# Patient Record
Sex: Male | Born: 1970 | Race: White | Hispanic: No | Marital: Married
Health system: Southern US, Community
[De-identification: ages and names within clinical notes are randomized; demographics above are authoritative.]

---

## 2000-01-29 ENCOUNTER — Encounter: Admission: RE | Admit: 2000-01-29 | Discharge: 2000-01-29 | Payer: Self-pay | Admitting: *Deleted

## 2000-01-29 ENCOUNTER — Encounter: Payer: Self-pay | Admitting: *Deleted

## 2012-06-02 ENCOUNTER — Ambulatory Visit
Admission: RE | Admit: 2012-06-02 | Discharge: 2012-06-02 | Disposition: A | Discharge status: Home / Self Care | Payer: BC Managed Care – PPO | Source: Ambulatory Visit | Attending: Family Medicine | Admitting: Family Medicine

## 2012-06-02 ENCOUNTER — Other Ambulatory Visit: Payer: Self-pay | Admitting: Family Medicine

## 2012-06-02 DIAGNOSIS — M25579 Pain in unspecified ankle and joints of unspecified foot: Secondary | ICD-10-CM

## 2013-12-12 IMAGING — CR DG FOOT COMPLETE 3+V*R*
3 series · 3 of 3 positions shown · non-contrast
Comparison: None.

CLINICAL DATA: Fell off ladder 2 weeks ago with pain laterally

RIGHT FOOT COMPLETE - 3+ VIEW

[t foot ap right]
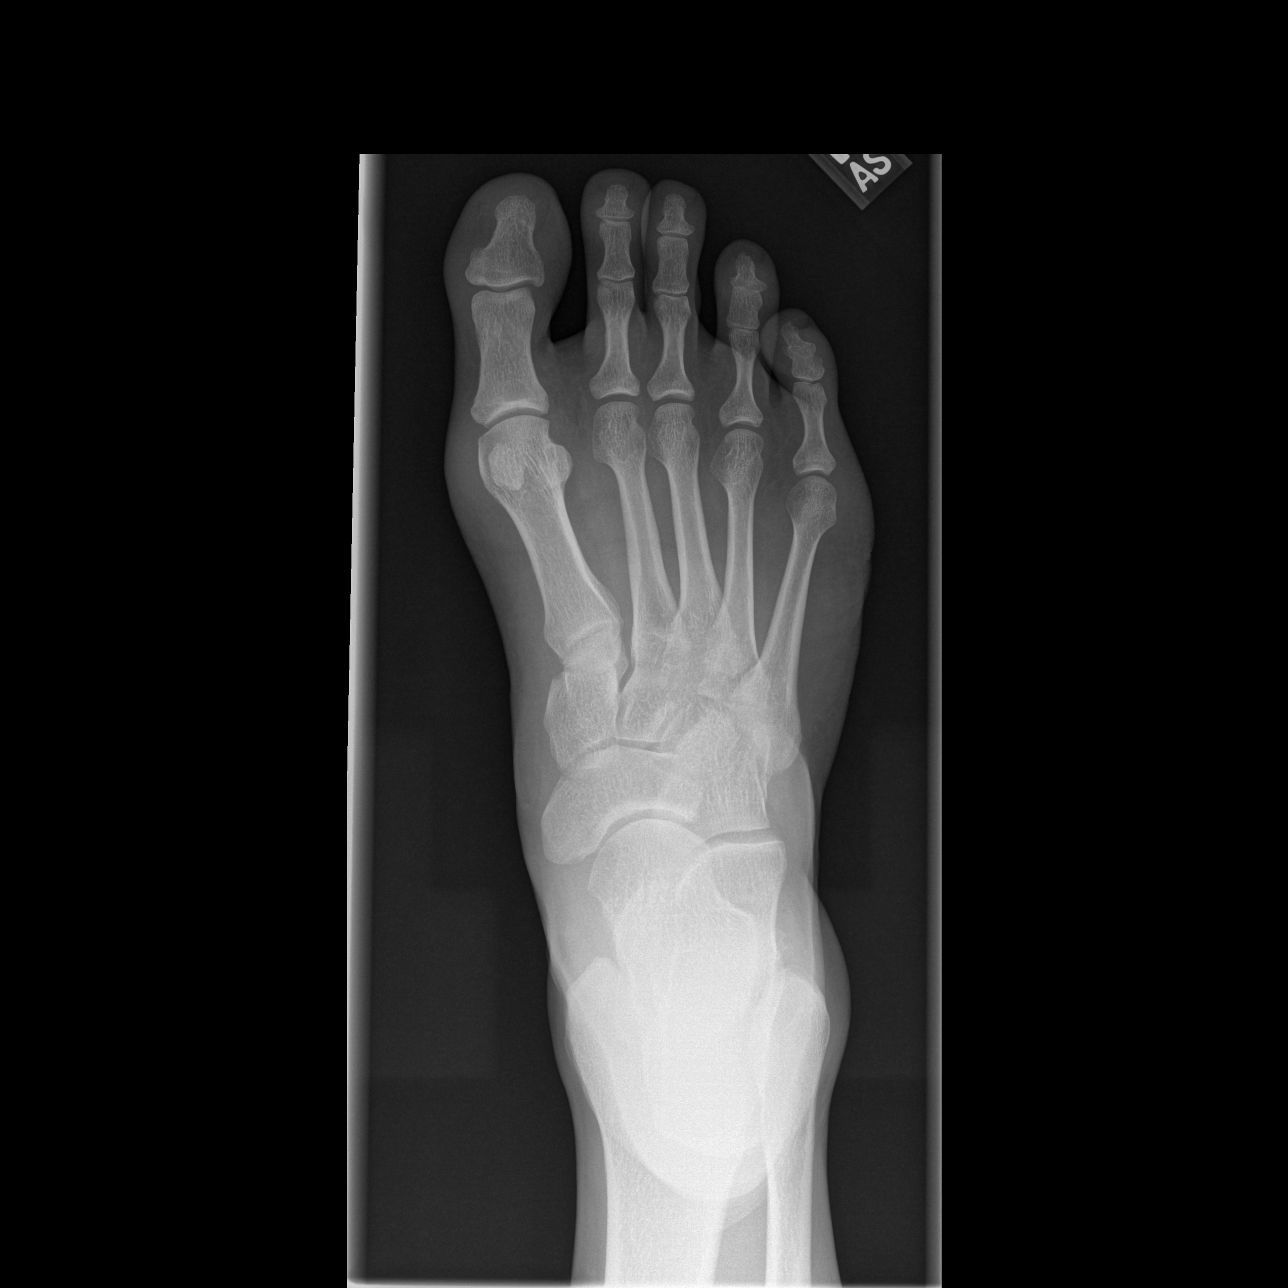

[t foot oblique right]
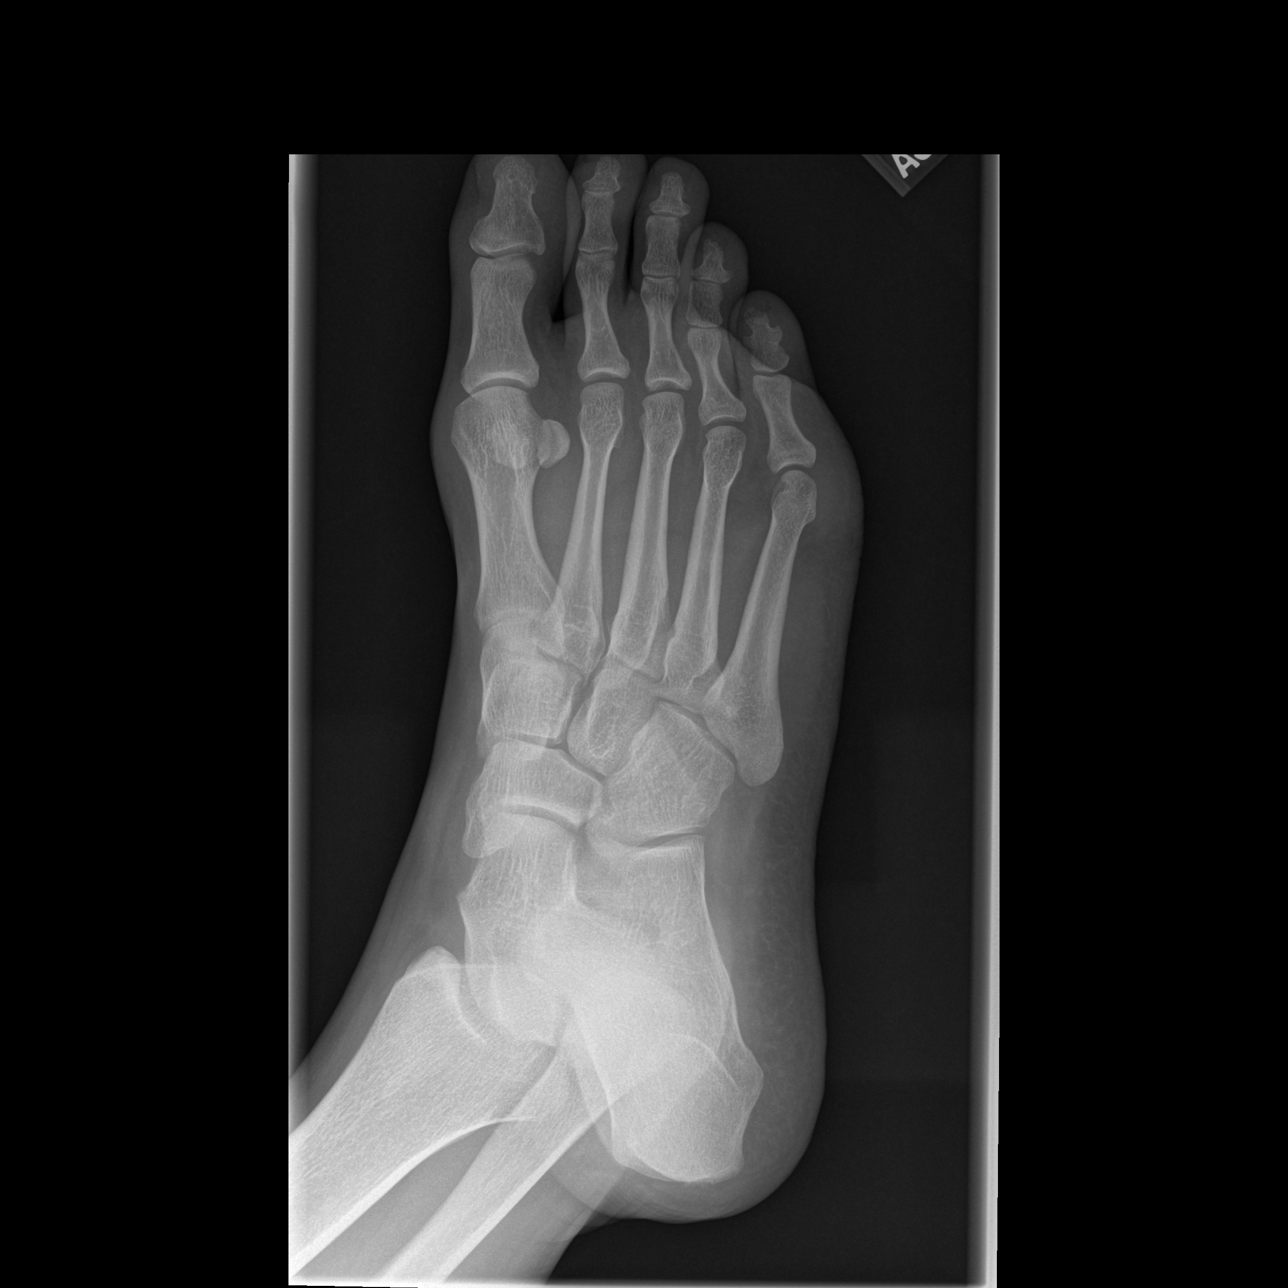

[t foot lat right]
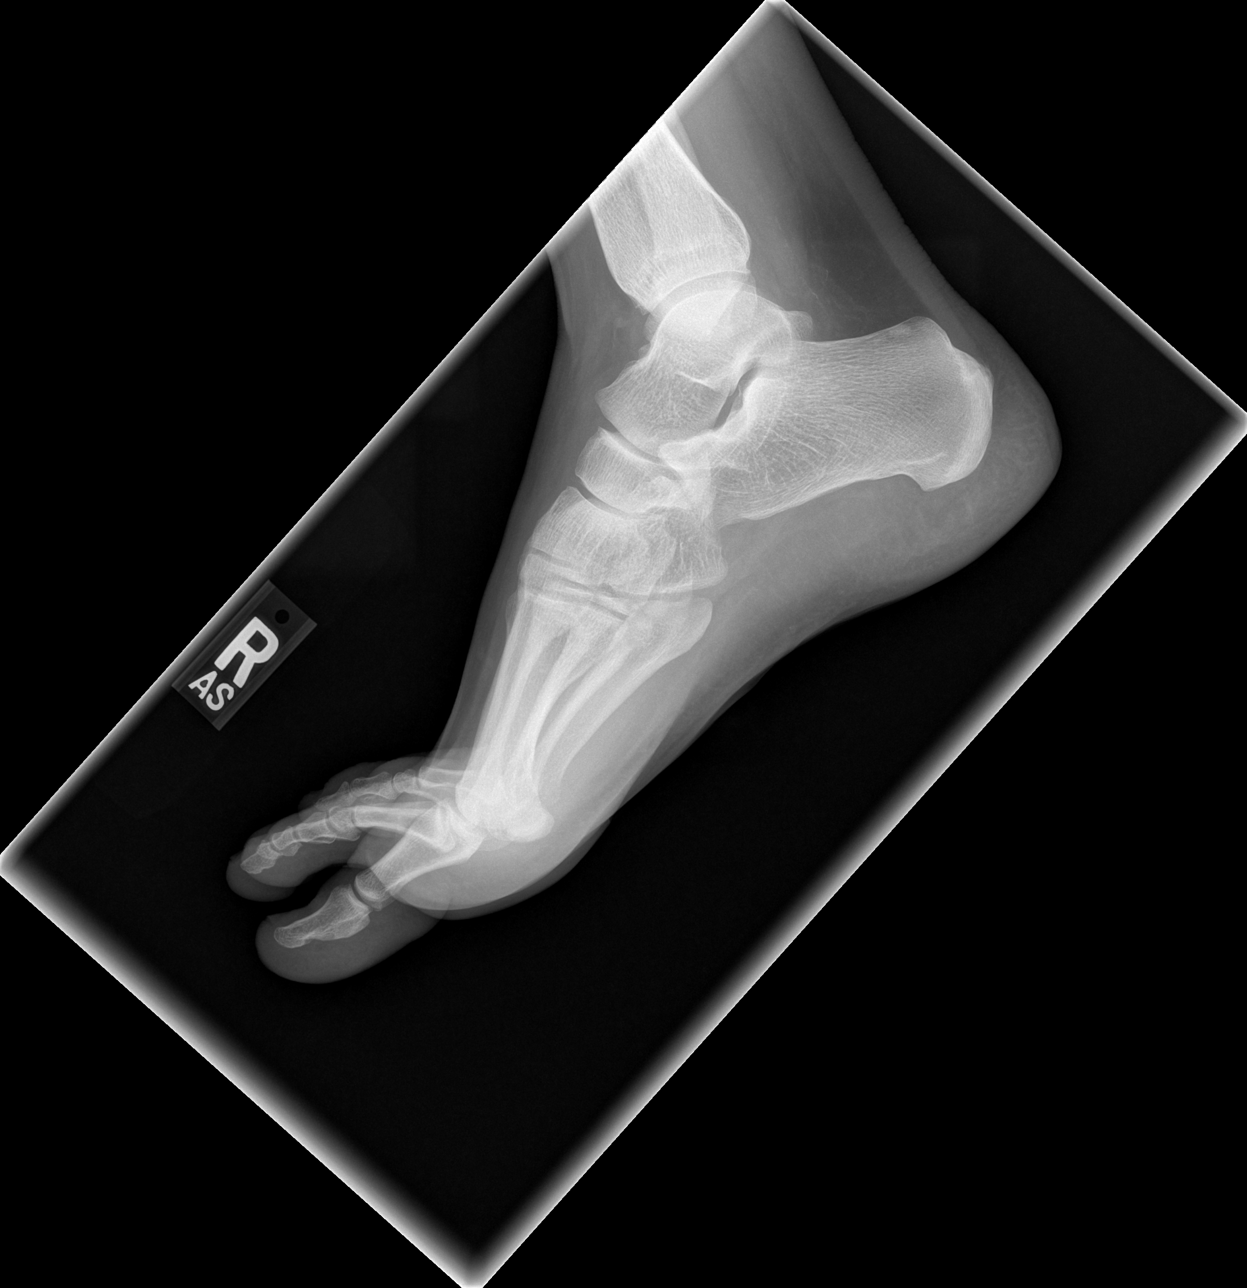

[3 of 3 positions shown; findings below may reference images not displayed]

FINDINGS: Tarsal - metatarsal alignment is normal.  No acute
fracture is seen.  Joint spaces appear normal.
IMPRESSION: Negative.

## 2019-12-13 ENCOUNTER — Ambulatory Visit: Payer: BC Managed Care – PPO | Attending: Family

## 2019-12-13 DIAGNOSIS — Z23 Encounter for immunization: Secondary | ICD-10-CM | POA: Insufficient documentation

## 2019-12-13 NOTE — Progress Notes (Signed)
   Cov[facility] Vaccination [facility]  Name:  Zachary Hendricks    MRN: [id number] DOB: 26-Jun-1971  12/13/2019  Mr. Lakey was observed post Covid-19 immunization for 15 minutes without incident. He was provided with Vaccine Information Sheet and instruction to access the V-Safe system.   Mr. Butcher was instructed to call 911 with any severe reactions post vaccine: Marland Kitchen Difficulty breathing  . Swelling of face and throat  . A fast heartbeat  . A bad rash all over body  . Dizziness and weakness   Immunizations Administered    Name Date Dose VIS Date Route   Moderna COVID-19 Vaccine 12/13/2019  3:07 PM 0.5 mL 09/11/2019 Intramuscular   Manufacturer: Moderna   Lot: [id number]   NDC: 33832-919-16

## 2020-01-15 ENCOUNTER — Ambulatory Visit: Payer: BC Managed Care – PPO | Attending: Family

## 2020-01-15 DIAGNOSIS — Z23 Encounter for immunization: Secondary | ICD-10-CM

## 2020-01-15 NOTE — Progress Notes (Signed)
   Covid-19 Vaccination Clinic  Name:  RONI SCOW    MRN: [id number] DOB: [birth year]  01/15/2020  Mr. Speckman was observed post Covid-19 immunization for 15 minutes without incident. He was provided with Vaccine Information Sheet and instruction to access the V-Safe system.   Mr. Nick was instructed to call 911 with any severe reactions post vaccine: Marland Kitchen Difficulty breathing  . Swelling of face and throat  . A fast heartbeat  . A bad rash all over body  . Dizziness and weakness   Immunizations Administered    Name Date Dose VIS Date Route   Moderna COVID-19 Vaccine 01/15/2020  3:07 PM 0.5 mL 09/11/2019 Intramuscular   Manufacturer: Moderna   Lot: [id number]   NDC: 78588-502-77
